# Patient Record
Sex: Male | Born: 1985 | Race: White | Hispanic: No | Marital: Single | Smoking: Never smoker
Health system: Southern US, Community
[De-identification: ages and names within clinical notes are randomized; demographics above are authoritative.]

---
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
## 2014-08-22 ENCOUNTER — Emergency Department (HOSPITAL_COMMUNITY)
Admission: EM | Admit: 2014-08-22 | Discharge: 2014-08-22 | Disposition: A | Discharge status: Home / Self Care | Payer: Self-pay | Attending: Emergency Medicine | Admitting: Emergency Medicine

## 2014-08-22 ENCOUNTER — Emergency Department (HOSPITAL_COMMUNITY): Payer: Self-pay

## 2014-08-22 ENCOUNTER — Encounter (HOSPITAL_COMMUNITY): Payer: Self-pay | Admitting: Emergency Medicine

## 2014-08-22 DIAGNOSIS — R109 Unspecified abdominal pain: Secondary | ICD-10-CM | POA: Insufficient documentation

## 2014-08-22 DIAGNOSIS — Z880 Allergy status to penicillin: Secondary | ICD-10-CM | POA: Insufficient documentation

## 2014-08-22 DIAGNOSIS — R3919 Other difficulties with micturition: Secondary | ICD-10-CM | POA: Insufficient documentation

## 2014-08-22 LAB — URINALYSIS, ROUTINE W REFLEX MICROSCOPIC
Bilirubin Urine: NEGATIVE
GLUCOSE, UA: NEGATIVE mg/dL
Hgb urine dipstick: NEGATIVE
Ketones, ur: NEGATIVE mg/dL
LEUKOCYTES UA: NEGATIVE
NITRITE: NEGATIVE
PH: 5.5 (ref 5.0–8.0)
PROTEIN: NEGATIVE mg/dL
Specific Gravity, Urine: 1.007 (ref 1.005–1.030)
Urobilinogen, UA: 0.2 mg/dL (ref 0.0–1.0)

## 2014-08-22 LAB — CBC WITH DIFFERENTIAL/PLATELET
Basophils Absolute: 0 10*3/uL (ref 0.0–0.1)
Basophils Relative: 0 % (ref 0–1)
Eosinophils Absolute: 0.1 10*3/uL (ref 0.0–0.7)
Eosinophils Relative: 2 % (ref 0–5)
HCT: 46.4 % (ref 39.0–52.0)
Hemoglobin: 16.7 g/dL (ref 13.0–17.0)
Lymphocytes Relative: 29 % (ref 12–46)
Lymphs Abs: 2 10*3/uL (ref 0.7–4.0)
MCH: 31.6 pg (ref 26.0–34.0)
MCHC: 36 g/dL (ref 30.0–36.0)
MCV: 87.9 fL (ref 78.0–100.0)
Monocytes Absolute: 0.8 10*3/uL (ref 0.1–1.0)
Monocytes Relative: 12 % (ref 3–12)
Neutro Abs: 4 10*3/uL (ref 1.7–7.7)
Neutrophils Relative %: 57 % (ref 43–77)
Platelets: 264 10*3/uL (ref 150–400)
RBC: 5.28 MIL/uL (ref 4.22–5.81)
RDW: 12.3 % (ref 11.5–15.5)
WBC: 6.9 10*3/uL (ref 4.0–10.5)

## 2014-08-22 LAB — BASIC METABOLIC PANEL
Anion gap: 8 (ref 5–15)
BUN: 12 mg/dL (ref 6–20)
CO2: 29 mmol/L (ref 22–32)
Calcium: 10.4 mg/dL — ABNORMAL HIGH (ref 8.9–10.3)
Chloride: 102 mmol/L (ref 101–111)
Creatinine, Ser: 0.77 mg/dL (ref 0.61–1.24)
GFR calc Af Amer: >60 mL/min (ref >60)
GFR calc non Af Amer: >60 mL/min (ref >60)
Glucose, Bld: 93 mg/dL (ref 65–99)
Potassium: 5.3 mmol/L — ABNORMAL HIGH (ref 3.5–5.1)
Sodium: 139 mmol/L (ref 135–145)

## 2014-08-22 LAB — POTASSIUM: Potassium: 4.1 mmol/L (ref 3.5–5.1)

## 2014-08-22 MED ORDER — IOHEXOL 300 MG/ML  SOLN
25.0000 mL | Freq: Once | INTRAMUSCULAR | Status: AC | PRN
  Administered 2014-08-22: 25 mL via ORAL

## 2014-08-22 MED ORDER — IOHEXOL 300 MG/ML  SOLN
80.0000 mL | Freq: Once | INTRAMUSCULAR | Status: AC | PRN
  Administered 2014-08-22: 100 mL via INTRAVENOUS

## 2014-08-22 NOTE — ED Notes (Signed)
Pt. Stated, I was masturbating and i wore a latex glove and i thinik the glove has come in my rectum and not come out. This was a month ago and Im having abdominal cramping.

## 2014-08-22 NOTE — ED Notes (Signed)
Patient transported to CT 

## 2014-08-22 NOTE — ED Notes (Signed)
Pt is in stable condition upon d/c and ambulates from ED. 

## 2014-08-22 NOTE — ED Provider Notes (Signed)
CSN: [id number]     Arrival date & time 08/22/14  0820 History   First MD Initiated Contact with Patient 08/22/14 [phone]     Chief Complaint  Patient presents with  . Foreign Body in Rectum     (Consider location/radiation/quality/duration/timing/severity/associated sxs/prior Treatment) HPI Comments: Patient presents to the emergency department with chief complaints of abdominal pain. Patient states that he has been having the symptoms for the past 3 weeks or so. States that he is concerned because symptoms started after he was using a latex covered plastic object to rectally masturbate. Patient states that he is uncertain if the latex portion came off. He states that he has had difficulty with bowel movements since then. He denies any blood in his stool or rectal bleeding. Patient states that he has persistent lower abdominal pain. Additionally, he reports that he has some difficulty with urination, but denies any dysuria. He denies any fevers, but states that he has had chills. He denies any chest pain, shortness of breath, nausea, vomiting, or diarrhea. There are no aggravating or alleviating factors. Patient has tried using a heating pad on his abdomen, which he suffered a superficial burn from.  The history is provided by the patient. No language interpreter was used.    History reviewed. No pertinent past medical history. History reviewed. No pertinent past surgical history. No family history on file. Social History  Substance Use Topics  . Smoking status: Never Smoker   . Smokeless tobacco: None  . Alcohol Use: No    Review of Systems  Constitutional: Negative for fever and chills.  Respiratory: Negative for shortness of breath.   Cardiovascular: Negative for chest pain.  Gastrointestinal: Positive for abdominal pain. Negative for nausea, vomiting, diarrhea and constipation.  Genitourinary: Positive for difficulty urinating. Negative for dysuria.      Allergies   Penicillins  Home Medications   Prior to Admission medications   Not on File   BP 144/88 mmHg  Pulse 103  Temp(Src) 98.4 F (36.9 C) (Oral)  Resp 17  Ht 5\' 8"  (1.727 m)  Wt 127 lb (57.607 kg)  BMI 19.31 kg/m2  SpO2 97% Physical Exam  Constitutional: He is oriented to person, place, and time. He appears well-developed and well-nourished.  HENT:  Head: Normocephalic and atraumatic.  Eyes: Conjunctivae and EOM are normal. Pupils are equal, round, and reactive to light. Right eye exhibits no discharge. Left eye exhibits no discharge. No scleral icterus.  Neck: Normal range of motion. Neck supple. No JVD present.  Cardiovascular: Normal rate, regular rhythm and normal heart sounds.  Exam reveals no gallop and no friction rub.   No murmur heard. Pulmonary/Chest: Effort normal and breath sounds normal. No respiratory distress. He has no wheezes. He has no rales. He exhibits no tenderness.  Abdominal: Soft. He exhibits no distension and no mass. There is no tenderness. There is no rebound and no guarding.  No focal abdominal tenderness, no RLQ tenderness or pain at McBurney's point, no RUQ tenderness or Murphy's sign, no left-sided abdominal tenderness, no fluid wave, or signs of peritonitis   Genitourinary:  Chaperone present for rectal exam, no gross deformity, no palpable foreign body, no gross blood, hemorrhoids, or fissures  Musculoskeletal: Normal range of motion. He exhibits no edema or tenderness.  Neurological: He is alert and oriented to person, place, and time.  Skin: Skin is warm and dry.  Psychiatric: He has a normal mood and affect. His behavior is normal. Judgment and thought content normal.  Nursing note and vitals reviewed.   ED Course  Procedures (including critical care time) Results for orders placed or performed during the hospital encounter of 08/22/14  CBC with Differential/Platelet  Result Value Ref Range   WBC 6.9 4.0 - 10.5 K/uL   RBC 5.28 4.22 - 5.81  MIL/uL   Hemoglobin 16.7 13.0 - 17.0 g/dL   HCT 69.6 29.5 - 28.4 %   MCV 87.9 78.0 - 100.0 fL   MCH 31.6 26.0 - 34.0 pg   MCHC 36.0 30.0 - 36.0 g/dL   RDW 13.2 44.0 - 10.2 %   Platelets 264 150 - 400 K/uL   Neutrophils Relative % 57 43 - 77 %   Neutro Abs 4.0 1.7 - 7.7 K/uL   Lymphocytes Relative 29 12 - 46 %   Lymphs Abs 2.0 0.7 - 4.0 K/uL   Monocytes Relative 12 3 - 12 %   Monocytes Absolute 0.8 0.1 - 1.0 K/uL   Eosinophils Relative 2 0 - 5 %   Eosinophils Absolute 0.1 0.0 - 0.7 K/uL   Basophils Relative 0 0 - 1 %   Basophils Absolute 0.0 0.0 - 0.1 K/uL  Basic metabolic panel  Result Value Ref Range   Sodium 139 135 - 145 mmol/L   Potassium 5.3 (H) 3.5 - 5.1 mmol/L   Chloride 102 101 - 111 mmol/L   CO2 29 22 - 32 mmol/L   Glucose, Bld 93 65 - 99 mg/dL   BUN 12 6 - 20 mg/dL   Creatinine, Ser 7.25 0.61 - 1.24 mg/dL   Calcium 36.6 (H) 8.9 - 10.3 mg/dL   GFR calc non Af Amer >60 >60 mL/min   GFR calc Af Amer >60 >60 mL/min   Anion gap 8 5 - 15  Urinalysis, Routine w reflex microscopic (not at [facility])  Result Value Ref Range   Color, Urine YELLOW YELLOW   APPearance CLEAR CLEAR   Specific Gravity, Urine 1.007 1.005 - 1.030   pH 5.5 5.0 - 8.0   Glucose, UA NEGATIVE NEGATIVE mg/dL   Hgb urine dipstick NEGATIVE NEGATIVE   Bilirubin Urine NEGATIVE NEGATIVE   Ketones, ur NEGATIVE NEGATIVE mg/dL   Protein, ur NEGATIVE NEGATIVE mg/dL   Urobilinogen, UA 0.2 0.0 - 1.0 mg/dL   Nitrite NEGATIVE NEGATIVE   Leukocytes, UA NEGATIVE NEGATIVE  Potassium  Result Value Ref Range   Potassium 4.1 3.5 - 5.1 mmol/L   Ct Abdomen Pelvis W Contrast  08/22/2014   CLINICAL DATA:  Low abdominal cramping with constipation for 1 month. Possible rectal foreign body. Initial encounter.  EXAM: CT ABDOMEN AND PELVIS WITH CONTRAST  TECHNIQUE: Multidetector CT imaging of the abdomen and pelvis was performed using the standard protocol following bolus administration of intravenous contrast.  CONTRAST:   OMNIPAQUE IOHEXOL 300 MG/ML  SOLN  COMPARISON:  None.  FINDINGS: Lower chest: Clear lung bases. No significant pleural or pericardial effusion.  Hepatobiliary: Tiny hepatic low density lesion in the dome of the right lobe on image number 10 is likely a cyst. The liver otherwise appears unremarkable. No evidence of gallstones, gallbladder wall thickening or biliary dilatation.  Pancreas: Unremarkable. No pancreatic ductal dilatation or surrounding inflammatory changes.  Spleen: Normal in size without focal abnormality.  Adrenals/Urinary Tract: Both adrenal glands appear normal. The kidneys appear normal without evidence of urinary tract calculus, suspicious lesion or hydronephrosis. No bladder abnormalities are seen.  Stomach/Bowel: No evidence of bowel wall thickening, distention or surrounding inflammatory change. The appendix appears normal.  The rectum appears normal without evidence of radiopaque foreign body or surrounding inflammation.  Vascular/Lymphatic: There are no enlarged abdominal or pelvic lymph nodes. No significant vascular findings are present.  Reproductive: Unremarkable.  Other: No evidence of abdominal wall mass or hernia.  Musculoskeletal: No acute or significant osseous findings. There is a mild convex left thoracolumbar scoliosis.  IMPRESSION: No acute findings or explanation for the patient's symptoms. No evidence of endo rectal foreign body.   Electronically Signed   By: Carey Bullocks M.D.   On: 08/22/2014 11:50      EKG Interpretation None      MDM   Final diagnoses:  Abdominal pain  Abdominal pain    Patient with abdominal pain x3 weeks. Concern for retained rectal foreign body.  Will get CT abd.    12:24 PM CT scan is negative. Patient has a long family history of irritable bowel, and Crohn's. Will recommend gastroenterology follow-up. Patient is not in any apparent distress. Vital signs are stable. Labs are reassuring. Initial potassium was high, this is thought to be  secondary to hemolysis, repeat potassium is normal. Will discharge to home. Patient understands agrees the plan. He is stable and ready for discharge.    Roxy Horseman, PA-C 08/22/14 1225  Laurence Spates, MD 08/22/14 [phone]

## 2014-08-22 NOTE — Discharge Instructions (Signed)

## 2016-09-08 IMAGING — CT CT ABD-PELV W/ CM
2 of 4 series · 11 of 46 positions shown, 12 images · IV contrast (Iodine)
Comparison: None.

CLINICAL DATA: Low abdominal cramping with constipation for 1
month. Possible rectal foreign body. Initial encounter.

EXAM:
CT ABDOMEN AND PELVIS WITH CONTRAST
TECHNIQUE: Multidetector CT imaging of the abdomen and pelvis was performed
using the standard protocol following bolus administration of
intravenous contrast.
CONTRAST:  100mL OMNIPAQUE IOHEXOL 300 MG/ML  SOLN

[Series 201: routine, idose (2) · axial · 0.61mm/px · z∈[-476,-111]mm · 8 of 89 slices shown, 9 images]
[im 8/89  soft-tissue]
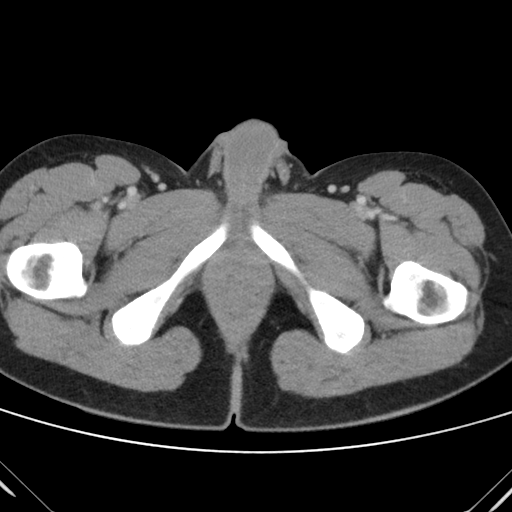
[im 8/89  bone]
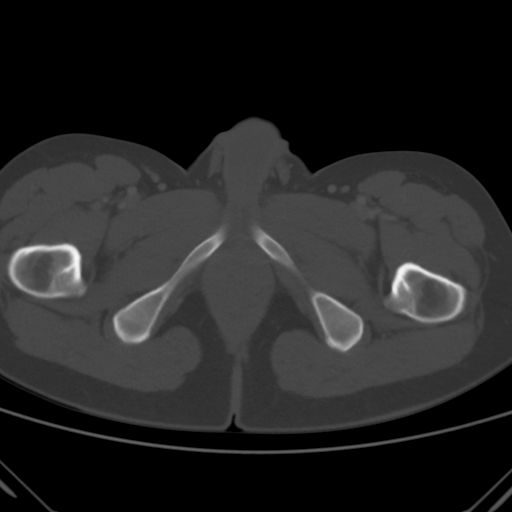
[im 19/89  soft-tissue]
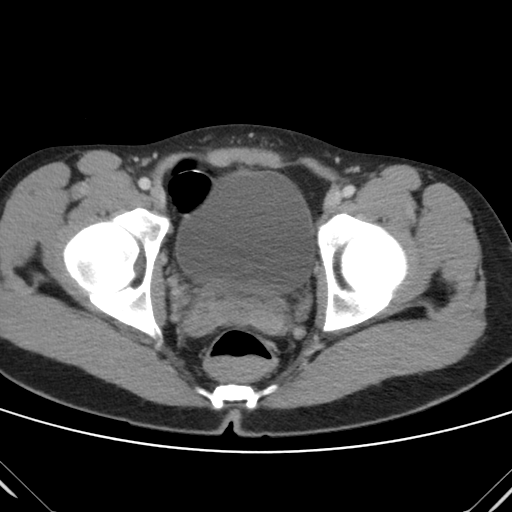
[im 30/89  soft-tissue]
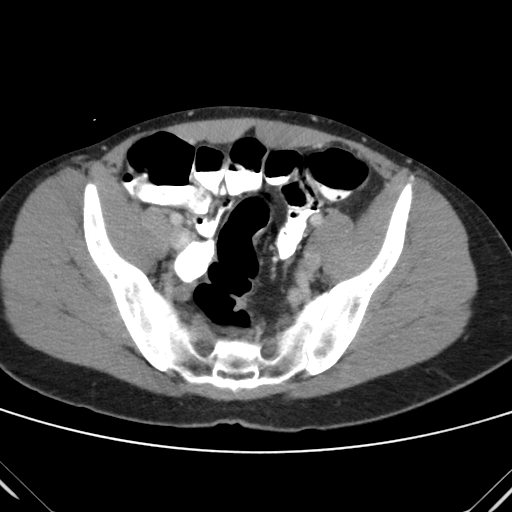
[im 41/89  soft-tissue]
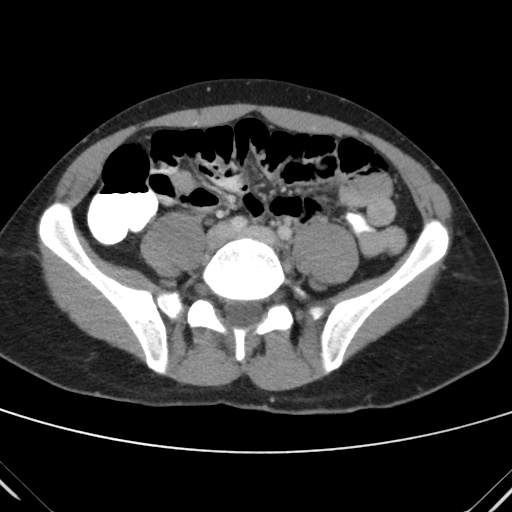
[im 48/89  soft-tissue]
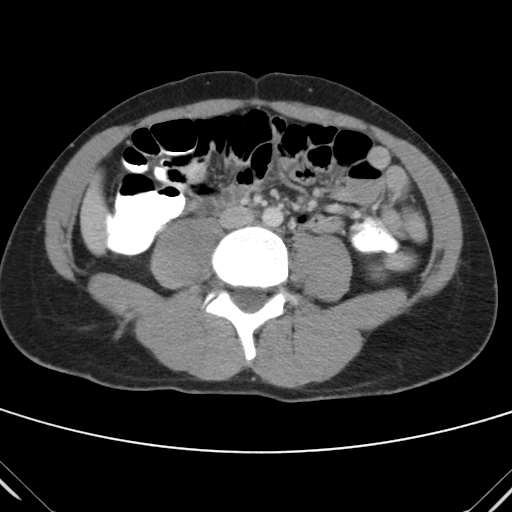
[im 59/89  soft-tissue]
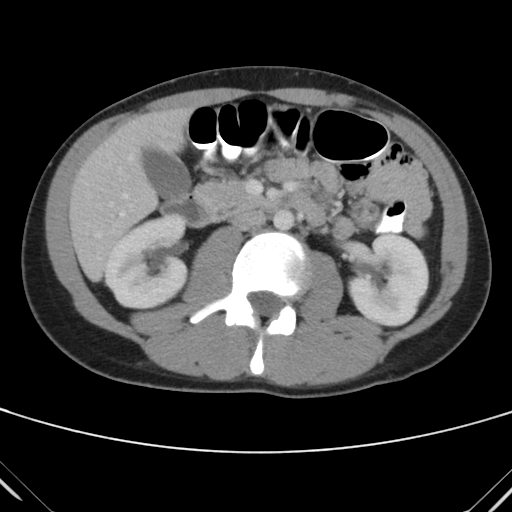
[im 70/89  soft-tissue]
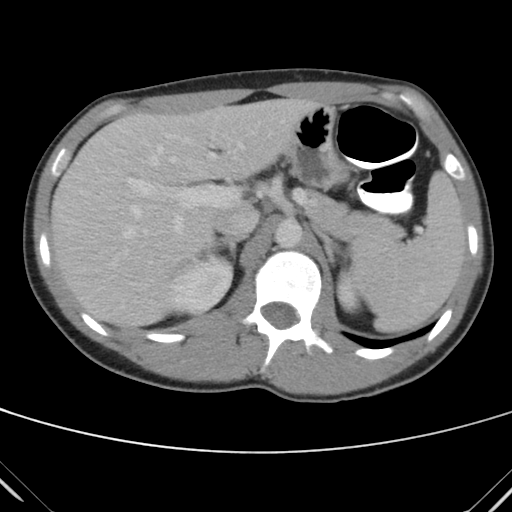
[im 81/89  soft-tissue]
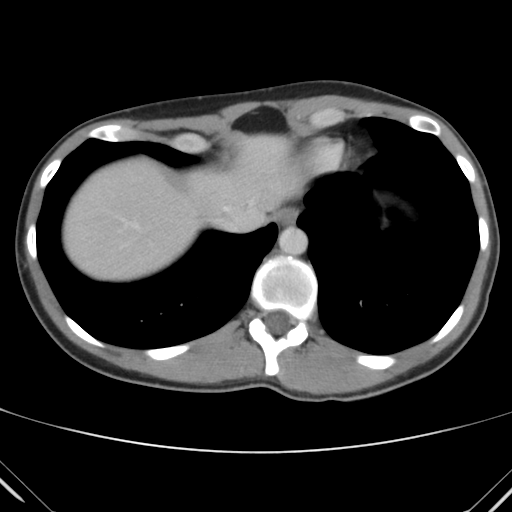

[Series 203: coronals, idose (2) · coronal · 0.45mm/px · 3 of 117 slices shown]
[im 39/117  soft-tissue]
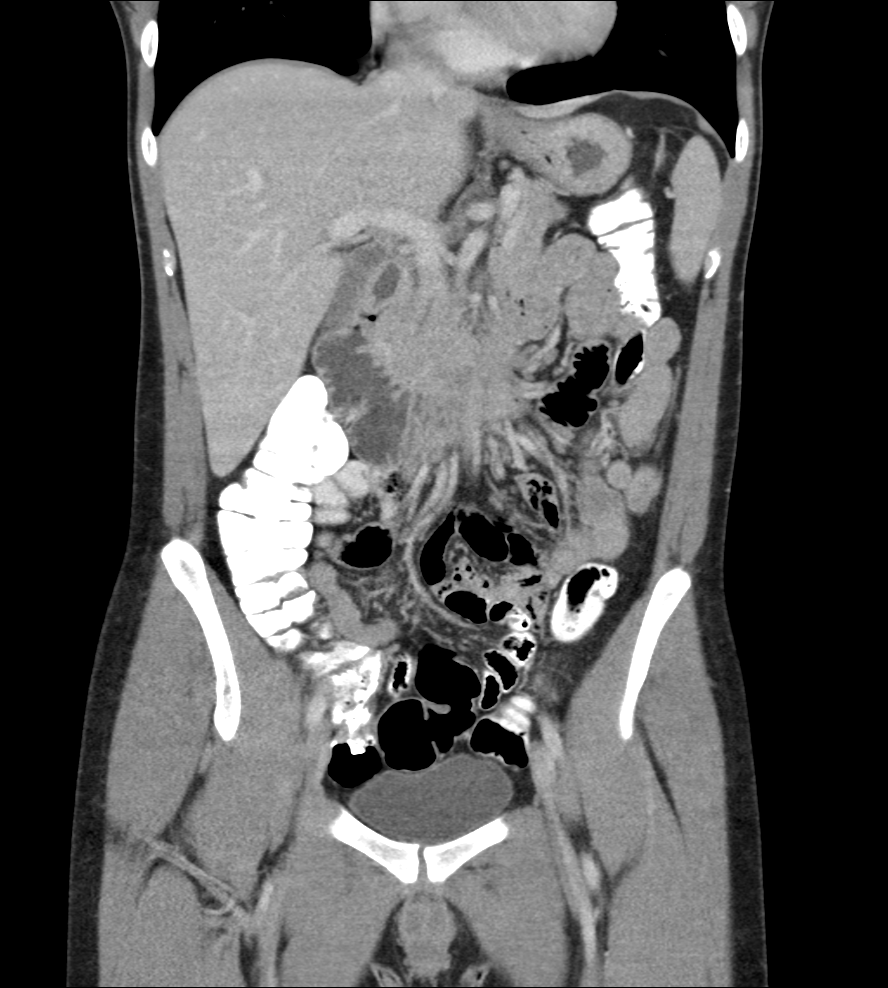
[im 52/117  soft-tissue]
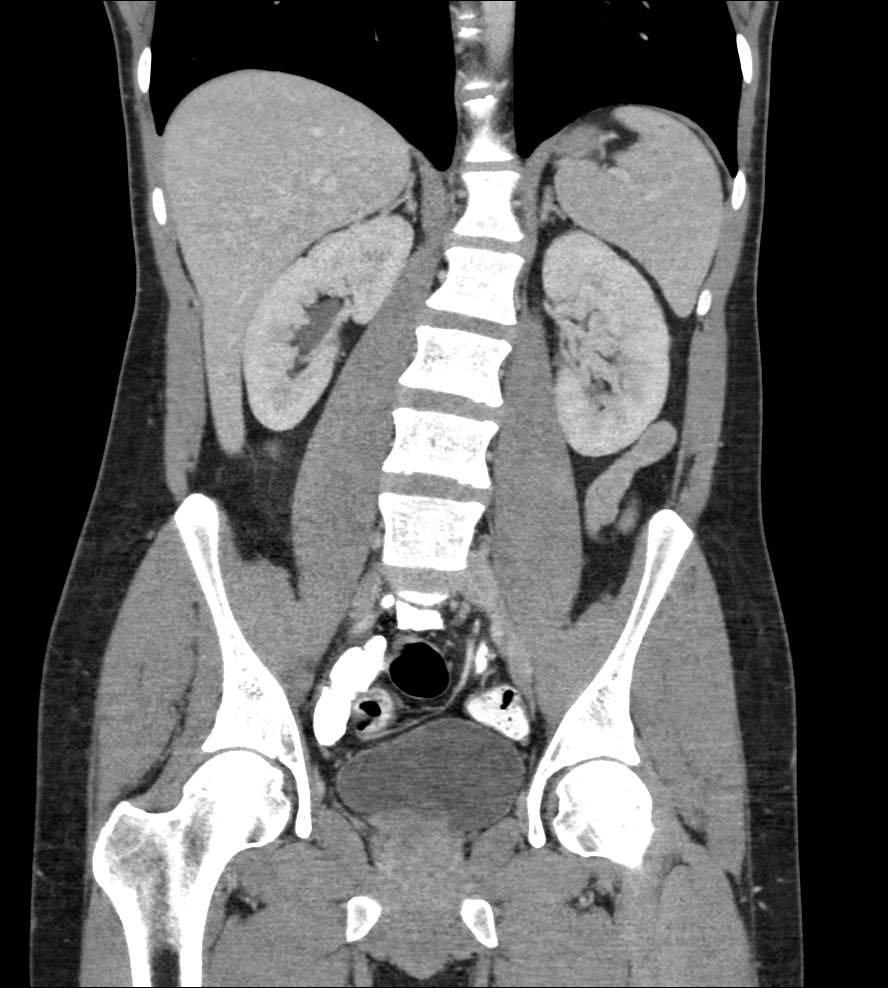
[im 65/117  soft-tissue]
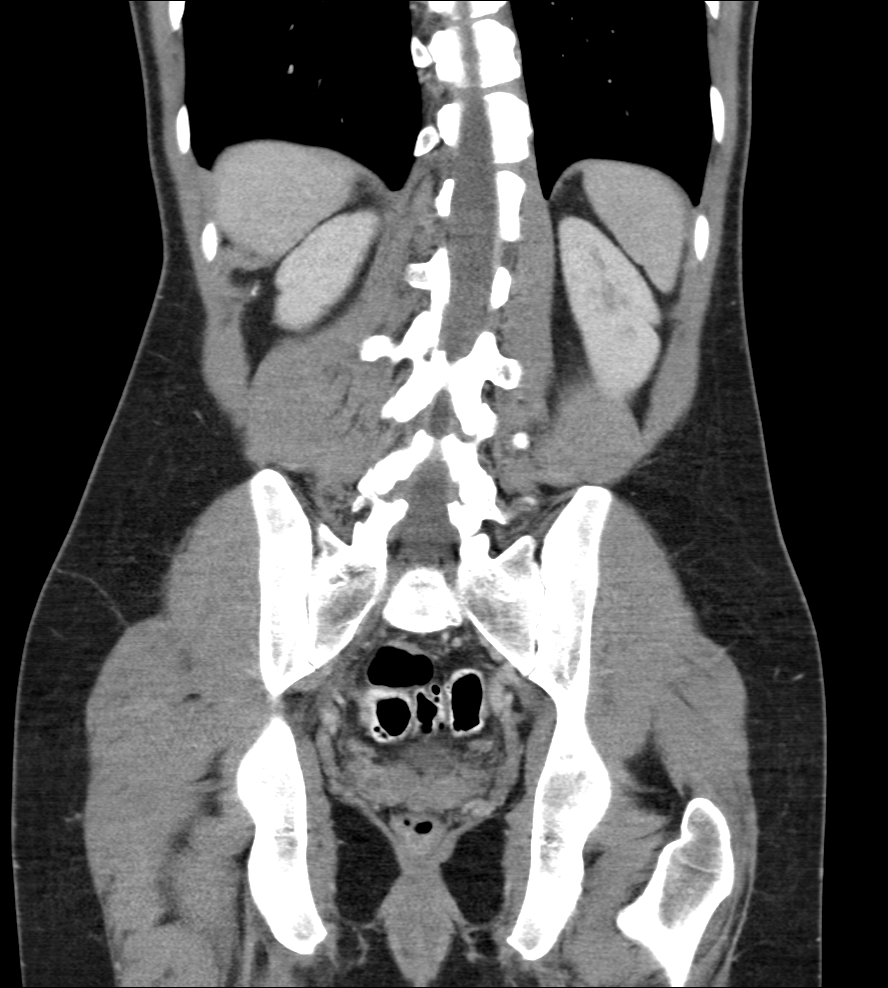

[11 of 46 positions shown; findings below may reference images not displayed]

FINDINGS: Lower chest: Clear lung bases. No significant pleural or pericardial
effusion.

Hepatobiliary: Tiny hepatic low density lesion in the dome of the
right lobe on image number 10 is likely a cyst. The liver otherwise
appears unremarkable. No evidence of gallstones, gallbladder wall
thickening or biliary dilatation.

Pancreas: Unremarkable. No pancreatic ductal dilatation or
surrounding inflammatory changes.

Spleen: Normal in size without focal abnormality.

Adrenals/Urinary Tract: Both adrenal glands appear normal. The
kidneys appear normal without evidence of urinary tract calculus,
suspicious lesion or hydronephrosis. No bladder abnormalities are
seen.

Stomach/Bowel: No evidence of bowel wall thickening, distention or
surrounding inflammatory change. The appendix appears normal. The
rectum appears normal without evidence of radiopaque foreign body or
surrounding inflammation.

Vascular/Lymphatic: There are no enlarged abdominal or pelvic lymph
nodes. No significant vascular findings are present.

Reproductive: Unremarkable.

Other: No evidence of abdominal wall mass or hernia.

Musculoskeletal: No acute or significant osseous findings. There is
a mild convex left thoracolumbar scoliosis.
IMPRESSION: No acute findings or explanation for the patient's symptoms. No
evidence of endo rectal foreign body.

## 2016-12-27 ENCOUNTER — Emergency Department (HOSPITAL_BASED_OUTPATIENT_CLINIC_OR_DEPARTMENT_OTHER)
Admission: EM | Admit: 2016-12-27 | Discharge: 2016-12-27 | Disposition: A | Discharge status: Home / Self Care | Payer: Self-pay | Attending: Physician Assistant | Admitting: Physician Assistant

## 2016-12-27 ENCOUNTER — Other Ambulatory Visit: Payer: Self-pay

## 2016-12-27 ENCOUNTER — Emergency Department (HOSPITAL_BASED_OUTPATIENT_CLINIC_OR_DEPARTMENT_OTHER): Payer: Self-pay

## 2016-12-27 ENCOUNTER — Encounter (HOSPITAL_BASED_OUTPATIENT_CLINIC_OR_DEPARTMENT_OTHER): Payer: Self-pay | Admitting: *Deleted

## 2016-12-27 DIAGNOSIS — R0602 Shortness of breath: Secondary | ICD-10-CM | POA: Insufficient documentation

## 2016-12-27 DIAGNOSIS — R05 Cough: Secondary | ICD-10-CM | POA: Insufficient documentation

## 2016-12-27 DIAGNOSIS — Z79899 Other long term (current) drug therapy: Secondary | ICD-10-CM | POA: Insufficient documentation

## 2016-12-27 DIAGNOSIS — R059 Cough, unspecified: Secondary | ICD-10-CM

## 2016-12-27 DIAGNOSIS — R21 Rash and other nonspecific skin eruption: Secondary | ICD-10-CM | POA: Insufficient documentation

## 2016-12-27 LAB — CBC WITH DIFFERENTIAL/PLATELET
Basophils Absolute: 0 10*3/uL (ref 0.0–0.1)
Basophils Relative: 0 %
Eosinophils Absolute: 0 10*3/uL (ref 0.0–0.7)
Eosinophils Relative: 0 %
HCT: 41.7 % (ref 39.0–52.0)
Hemoglobin: 14.9 g/dL (ref 13.0–17.0)
Lymphocytes Relative: 20 %
Lymphs Abs: 1.9 10*3/uL (ref 0.7–4.0)
MCH: 30.6 pg (ref 26.0–34.0)
MCHC: 35.7 g/dL (ref 30.0–36.0)
MCV: 85.6 fL (ref 78.0–100.0)
Monocytes Absolute: 0.6 10*3/uL (ref 0.1–1.0)
Monocytes Relative: 7 %
Neutro Abs: 7 10*3/uL (ref 1.7–7.7)
Neutrophils Relative %: 73 %
Platelets: 324 10*3/uL (ref 150–400)
RBC: 4.87 MIL/uL (ref 4.22–5.81)
RDW: 11.9 % (ref 11.5–15.5)
WBC: 9.5 10*3/uL (ref 4.0–10.5)

## 2016-12-27 LAB — BASIC METABOLIC PANEL
Anion gap: 9 (ref 5–15)
BUN: 10 mg/dL (ref 6–20)
CO2: 24 mmol/L (ref 22–32)
Calcium: 9.6 mg/dL (ref 8.9–10.3)
Chloride: 105 mmol/L (ref 101–111)
Creatinine, Ser: 0.61 mg/dL (ref 0.61–1.24)
GFR calc Af Amer: >60 mL/min (ref >60)
GFR calc non Af Amer: >60 mL/min (ref >60)
Glucose, Bld: 99 mg/dL (ref 65–99)
Potassium: 3.5 mmol/L (ref 3.5–5.1)
Sodium: 138 mmol/L (ref 135–145)

## 2016-12-27 MED ORDER — BENZONATATE 100 MG PO CAPS: 100.0000 mg | ORAL_CAPSULE | Freq: Three times a day (TID) | ORAL | 0 refills | Status: AC

## 2016-12-27 MED ORDER — SODIUM CHLORIDE 0.9 % IV BOLUS (SEPSIS)
1000.0000 mL | Freq: Once | INTRAVENOUS | Status: AC
  Administered 2016-12-27: 1000 mL via INTRAVENOUS

## 2016-12-27 MED ORDER — FLUTICASONE PROPIONATE 50 MCG/ACT NA SUSP: 2.0000 | Freq: Every day | NASAL | 0 refills | Status: AC

## 2016-12-27 NOTE — Discharge Instructions (Signed)
Take Tessalon for cough.  Flonase for nasal congestion.  Drink plenty of fluids and get plenty of rest.  You may take Benadryl every 6 hours as needed for your rash, and additionally 20 mg pepcid twice daily.  Follow-up with a primary care physician for reevaluation of your symptoms in the next week or so.  Return to the ED immediately for any concerning signs or symptoms develop such as swelling of the lips or tongue, persisting shortness of breath or chest pain, or high fevers.

## 2016-12-27 NOTE — ED Notes (Signed)
Pt and FM given d/c instructions as per chart. Rx x 2. Verbalizes understanding. No questions. 

## 2016-12-27 NOTE — ED Triage Notes (Signed)
Pt reports he has been ill x 2 weeks with cough, C/o chest pain since yesterday. Alert, speaking full sentences, NAD. States cough was productive but now "the mucous isn't coming up"

## 2016-12-27 NOTE — ED Provider Notes (Signed)
[facility] EMERGENCY DEPARTMENT Provider Note   CSN: [id number] Arrival date & time: 12/27/16  1610     History   Chief Complaint Chief Complaint  Patient presents with  . Shortness of Breath    HPI Wayne Lindsey is a 31 y.o. male who presents today with chief complaint acute onset, progressively worsening shortness of breath for 2 weeks.  He notes cough productive of green sputum.  He denies chest pain but endorses chest tightness.  Shortness of breath worsens with activity and exertion.  Denies fevers or chills.  Endorses mild nasal congestion and sore throat which have resolved.  He denies abdominal pain, nausea, or vomiting.  He also endorses lightheadedness which worsens with activity and position changes.  He endorses orthopnea and PND but denies leg swelling.  He has tried Mucinex and other over-the-counter medications without significant relief of his symptoms.  Denies recent travel or surgeries, hemoptysis, prior history of DVT or PE, or testosterone placement therapy.  No significant cardiac or pulmonary family history.  He also notes development of a rash to his extremities and chest which she noted today.  Rash is nonpruritic and not painful.  He denies any recent insect bites or other new environmental exposures.  The history is provided by the patient.    History reviewed. No pertinent past medical history.  There are no active problems to display for this patient.   History reviewed. No pertinent surgical history.     Home Medications    Prior to Admission medications   Medication Sig Start Date End Date Taking? Authorizing Provider  benzonatate (TESSALON) 100 MG capsule Take 1 capsule (100 mg total) by mouth every 8 (eight) hours. 12/27/16   Nalani Andreen A, PA-C  diphenhydramine-acetaminophen (TYLENOL PM) 25-500 MG TABS Take 1 tablet by mouth at bedtime as needed (pain).    [provider]  fluticasone (FLONASE) 50 MCG/ACT nasal spray Place  2 sprays into both nostrils daily. 12/27/16   Fortino Haag A, PA-C  HYDROcodone-acetaminophen (VICODIN) 5-500 MG per tablet Take 1 tablet by mouth every 6 (six) hours as needed for pain.    [provider]  polyethylene glycol (MIRALAX / GLYCOLAX) packet Take 17 g by mouth daily as needed for mild constipation.    [provider]  ranitidine (ZANTAC) 150 MG tablet Take 150 mg by mouth 2 (two) times daily as needed for heartburn.    [provider]    Family History No family history on file.  Social History Social History   Tobacco Use  . Smoking status: Never Smoker  . Smokeless tobacco: Never Used  Substance Use Topics  . Alcohol use: No  . Drug use: Yes    Types: Marijuana     Allergies   Penicillins   Review of Systems Review of Systems  Constitutional: Negative for chills and fever.  HENT: Positive for congestion and sore throat.   Respiratory: Positive for cough, chest tightness and shortness of breath.   Cardiovascular: Negative for chest pain, palpitations and leg swelling.  Gastrointestinal: Negative for abdominal pain, nausea and vomiting.  Skin: Positive for rash.  Neurological: Positive for light-headedness. Negative for syncope and headaches.  All other systems reviewed and are negative.    Physical Exam Updated Vital Signs BP 131/84   Pulse 80   Temp 98.1 F (36.7 C) (Oral)   Resp 16   Ht 5\' 8"  (1.727 m)   Wt 59.9 kg (132 lb)   SpO2 99%  BMI 20.07 kg/m   Physical Exam  Constitutional: He appears well-developed and well-nourished. No distress.  HENT:  Head: Normocephalic and atraumatic.  Mouth/Throat: Oropharynx is clear and moist. No oropharyngeal exudate or posterior oropharyngeal edema.  No frontal or maxillary sinus TTP.  Posterior oropharynx unremarkable with no erythema, tonsillar hypertrophy, uvular deviation, or exudates.  Nasal septum midline with mild mucosal edema bilaterally.  Eyes: Conjunctivae are normal.  Pupils are equal, round, and reactive to light. Right eye exhibits no discharge. Left eye exhibits no discharge.  Neck: Normal range of motion. Neck supple. No JVD present. No tracheal deviation present.  Cardiovascular: Normal rate, normal heart sounds and intact distal pulses.  No murmur heard. Pulses:      Carotid pulses are on the right side with bruit. 2+ radial and DP/PT pulses bl, negative Homan's bl   Pulmonary/Chest: Effort normal and breath sounds normal. No accessory muscle usage. No tachypnea. He exhibits no tenderness and no swelling.  Abdominal: Soft. Bowel sounds are normal. He exhibits no distension. There is no tenderness.  Musculoskeletal: Normal range of motion. He exhibits no edema.       Right lower leg: He exhibits no tenderness and no edema.       Left lower leg: He exhibits no tenderness and no edema.  Neurological: He is alert.  Skin: Skin is warm and dry. Rash noted. No erythema.  Erythematous raised lesions noted to the bilateral lower extremities and anterior chest.  Nontender.  Scattered telangectasias noted.  Psychiatric: He has a normal mood and affect. His behavior is normal.  Nursing note and vitals reviewed.    ED Treatments / Results  Labs (all labs ordered are listed, but only abnormal results are displayed) Labs Reviewed  CBC WITH DIFFERENTIAL/PLATELET  BASIC METABOLIC PANEL    EKG  EKG Interpretation  Date/Time:  Sunday December 27 2016 16:41:07 EST Ventricular Rate:  101 PR Interval:  124 QRS Duration: 82 QT Interval:  326 QTC Calculation: 422 R Axis:   90 Text Interpretation:  Sinus tachycardia with Premature atrial complexes Biatrial enlargement Rightward axis Pulmonary disease pattern Left ventricular hypertrophy Abnormal ECG tachycardia noted  Confirmed by Bary Castilla ([id number]) on 12/27/2016 6:34:57 PM       Radiology Dg Chest 2 View  Result Date: 12/27/2016 CLINICAL DATA:  Shortness of breath.  Cough EXAM: CHEST  2 VIEW  COMPARISON:  None. FINDINGS: The heart size and mediastinal contours are within normal limits. Both lungs are clear. Pectus deformity noted. Scoliosis. IMPRESSION: No acute cardiopulmonary abnormalities. Electronically Signed   By: Signa Kell M.D.   On: 12/27/2016 17:00    Procedures Procedures (including critical care time)  Medications Ordered in ED Medications  sodium chloride 0.9 % bolus 1,000 mL (0 mLs Intravenous Stopped 12/27/16 1923)     Initial Impression / Assessment and Plan / ED Course  I have reviewed the triage vital signs and the nursing notes.  Pertinent labs & imaging results that were available during my care of the patient were reviewed by me and considered in my medical decision making (see chart for details).     Patient presents with 2 weeks of shortness of breath and productive cough.  Afebrile, vital signs are stable.  He is nontoxic in appearance.  Chest x-ray shows no evidence of acute cardiopulmonary abnormality such as pulmonary edema or pneumonia.  EKG shows mild sinus tachycardia but otherwise no significant ST segment changes or arrhythmia.  He is not tachycardic on my evaluation.  I doubt PE in the absence of risk factors and with no chest pain.  Doubt ACS or MI in the absence of chest pain.  Rash is not concerning for syphilis, tickborne illness, SJS, TENS, or other concerning/life-threatening dermatological condition.  Possible viral exanthem versus insect bites.  Lab work is reassuring.  He is stable for discharge home with symptomatic treatment of his cough and rash and follow-up with primary care physician.  Discussed indications for return to the ED.  Patient and patient's mother verbalized understanding of and agreement with plan and patient stable for discharge home at this time.  He has no complaints prior to discharge.  Final Clinical Impressions(s) / ED Diagnoses   Final diagnoses:  Shortness of breath  Cough  Rash    ED Discharge Orders         Ordered    benzonatate (TESSALON) 100 MG capsule  Every 8 hours     12/27/16 1937    fluticasone (FLONASE) 50 MCG/ACT nasal spray  Daily     12/27/16 1937       Jeanie SewerFawze, Zuzu Befort A, PA-C 12/27/16 1942    Abelino DerrickMackuen, Courteney Lyn, MD 12/28/16 0003

## 2016-12-27 NOTE — ED Notes (Signed)
Pt claimed that he is coughing but unable to cough up secretions.

## 2019-01-14 IMAGING — CR DG CHEST 2V
2 series · 2 of 2 positions shown · non-contrast
Comparison: None.

CLINICAL DATA: Shortness of breath.  Cough

EXAM:
CHEST  2 VIEW

[w chest pa]
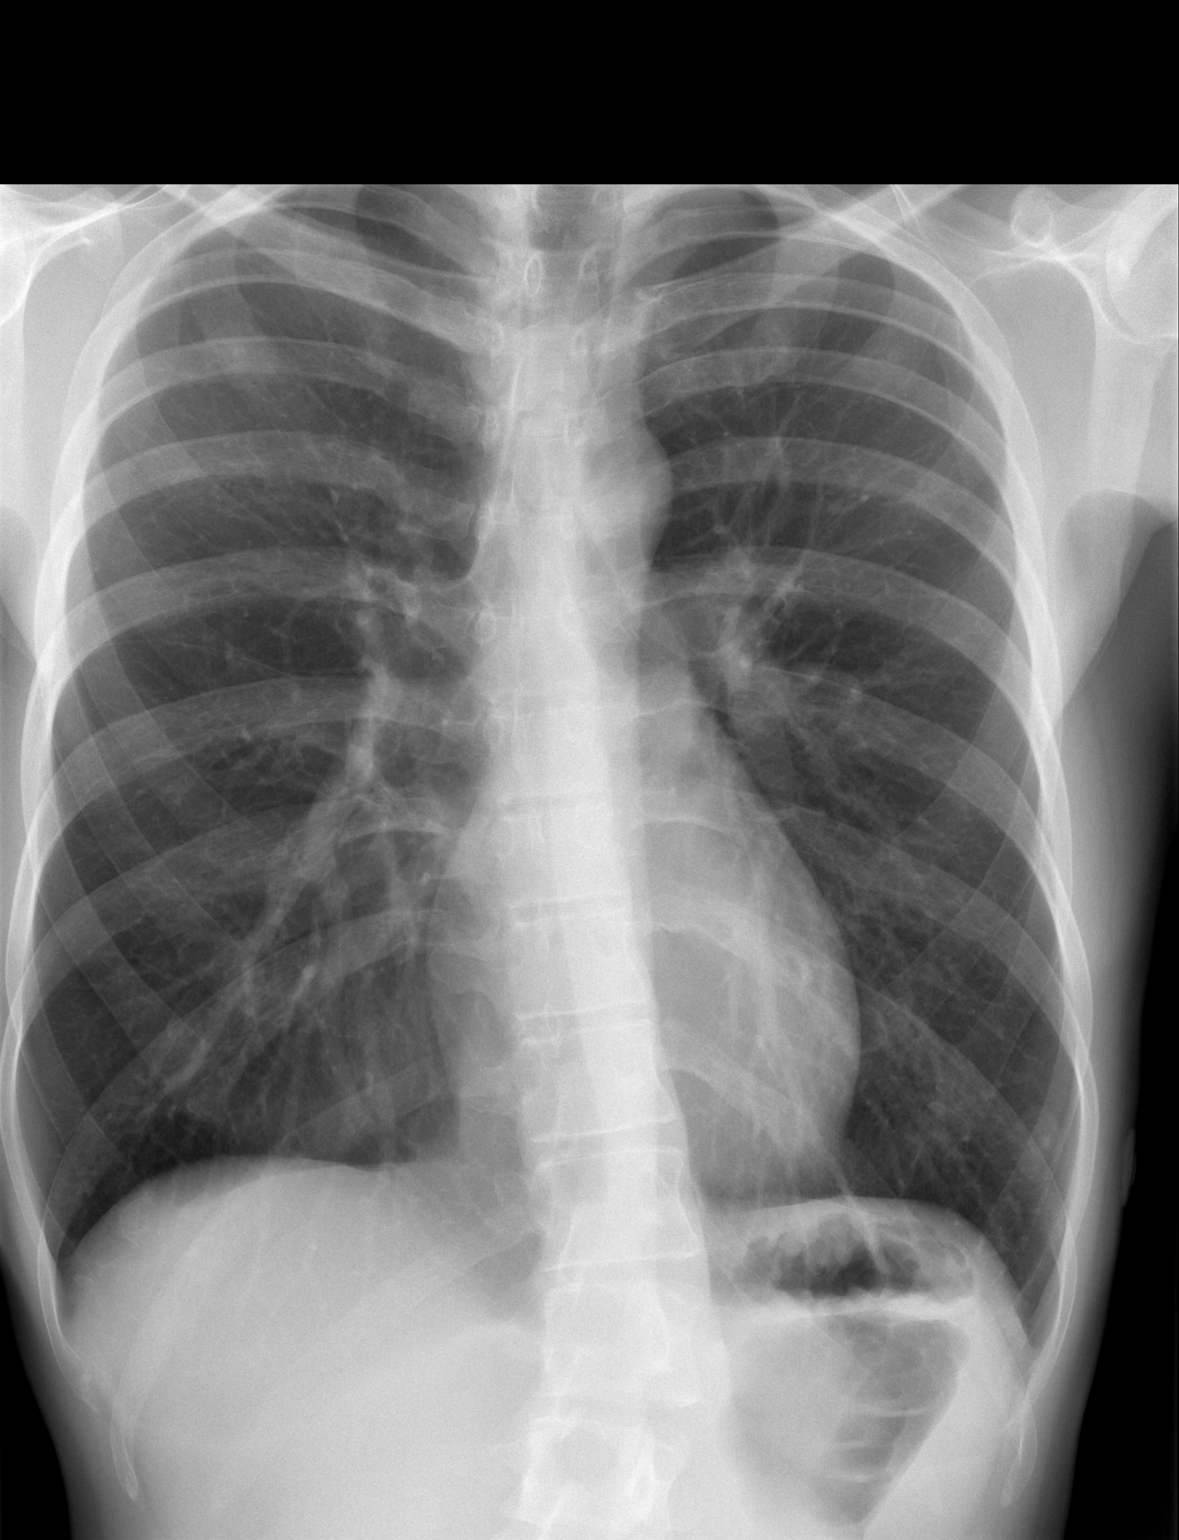

[w chest lat]
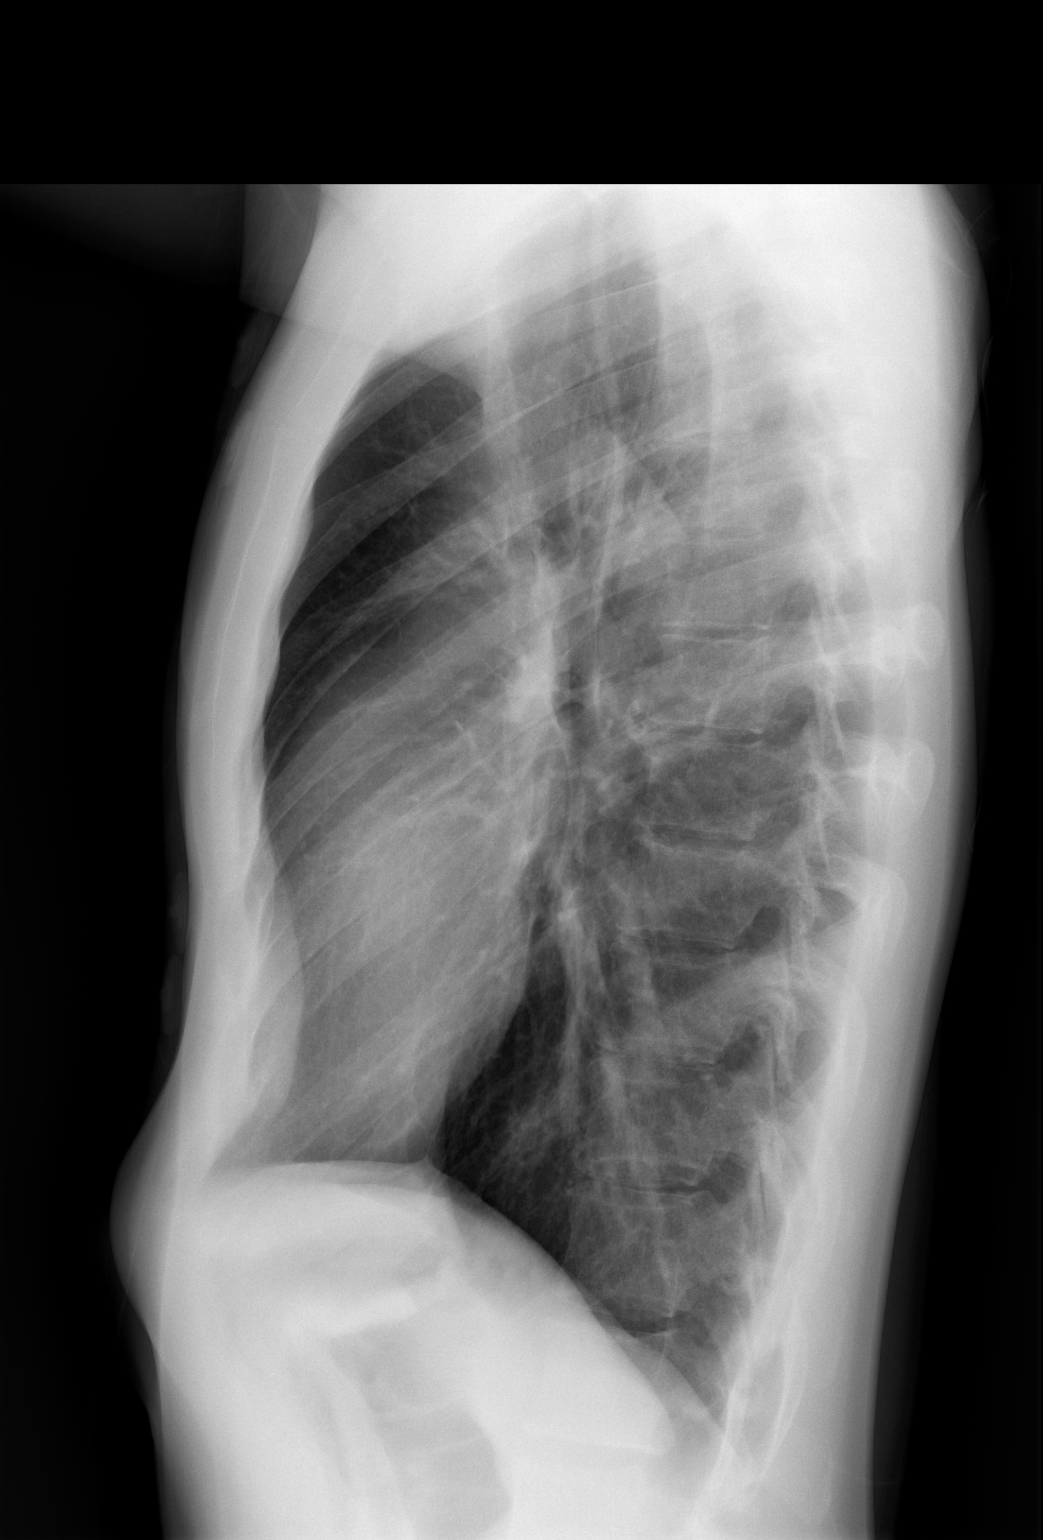

[2 of 2 positions shown; findings below may reference images not displayed]

FINDINGS: The heart size and mediastinal contours are within normal limits.
Both lungs are clear. Pectus deformity noted. Scoliosis.
IMPRESSION: No acute cardiopulmonary abnormalities.
